# Patient Record
Sex: Male | Born: 1945 | Race: Black or African American | Hispanic: No | Smoking: Never smoker
Health system: Southern US, Community
[De-identification: ages and names within clinical notes are randomized; demographics above are authoritative.]

## PROBLEM LIST (undated history)

## (undated) DIAGNOSIS — M719 Bursopathy, unspecified: Secondary | ICD-10-CM

## (undated) DIAGNOSIS — M1990 Unspecified osteoarthritis, unspecified site: Secondary | ICD-10-CM

## (undated) DIAGNOSIS — R768 Other specified abnormal immunological findings in serum: Secondary | ICD-10-CM

## (undated) DIAGNOSIS — R7689 Other specified abnormal immunological findings in serum: Secondary | ICD-10-CM

## (undated) DIAGNOSIS — I10 Essential (primary) hypertension: Secondary | ICD-10-CM

## (undated) DIAGNOSIS — E559 Vitamin D deficiency, unspecified: Secondary | ICD-10-CM

## (undated) DIAGNOSIS — R7303 Prediabetes: Secondary | ICD-10-CM

## (undated) HISTORY — DX: Bursopathy, unspecified: M71.9

## (undated) HISTORY — DX: Unspecified osteoarthritis, unspecified site: M19.90

## (undated) HISTORY — DX: Essential (primary) hypertension: I10

## (undated) HISTORY — DX: Prediabetes: R73.03

## (undated) HISTORY — DX: Other specified abnormal immunological findings in serum: R76.8

## (undated) HISTORY — DX: Other specified abnormal immunological findings in serum: R76.89

## (undated) HISTORY — DX: Vitamin D deficiency, unspecified: E55.9

---

## 2019-02-13 ENCOUNTER — Emergency Department (HOSPITAL_COMMUNITY)
Admission: EM | Admit: 2019-02-13 | Discharge: 2019-02-13 | Disposition: A | Discharge status: Home / Self Care | Payer: Self-pay | Attending: Emergency Medicine | Admitting: Emergency Medicine

## 2019-02-13 ENCOUNTER — Emergency Department (HOSPITAL_COMMUNITY): Payer: Self-pay

## 2019-02-13 DIAGNOSIS — M19042 Primary osteoarthritis, left hand: Secondary | ICD-10-CM

## 2019-02-13 DIAGNOSIS — M7042 Prepatellar bursitis, left knee: Secondary | ICD-10-CM | POA: Insufficient documentation

## 2019-02-13 DIAGNOSIS — Y939 Activity, unspecified: Secondary | ICD-10-CM | POA: Insufficient documentation

## 2019-02-13 LAB — POCT I-STAT EG7
Acid-base deficit: 2 mmol/L (ref 0.0–2.0)
Bicarbonate: 23.6 mmol/L (ref 20.0–28.0)
Calcium, Ion: 1.07 mmol/L — ABNORMAL LOW (ref 1.15–1.40)
HCT: 36 % — ABNORMAL LOW (ref 39.0–52.0)
Hemoglobin: 12.2 g/dL — ABNORMAL LOW (ref 13.0–17.0)
O2 Saturation: 59 %
PCO2 VEN: 43.3 mmHg — AB (ref 44.0–60.0)
Potassium: 3.6 mmol/L (ref 3.5–5.1)
Sodium: 139 mmol/L (ref 135–145)
TCO2: 25 mmol/L (ref 22–32)
pH, Ven: 7.344 (ref 7.250–7.430)
pO2, Ven: 33 mmHg (ref 32.0–45.0)

## 2019-02-13 LAB — CBC WITH DIFFERENTIAL/PLATELET
Abs Immature Granulocytes: 0.01 10*3/uL (ref 0.00–0.07)
Basophils Absolute: 0 10*3/uL (ref 0.0–0.1)
Basophils Relative: 0 %
Eosinophils Absolute: 0.1 10*3/uL (ref 0.0–0.5)
Eosinophils Relative: 2 %
HCT: 38.5 % — ABNORMAL LOW (ref 39.0–52.0)
Hemoglobin: 12.2 g/dL — ABNORMAL LOW (ref 13.0–17.0)
Immature Granulocytes: 0 %
Lymphocytes Relative: 22 %
Lymphs Abs: 1.1 10*3/uL (ref 0.7–4.0)
MCH: 29.8 pg (ref 26.0–34.0)
MCHC: 31.7 g/dL (ref 30.0–36.0)
MCV: 93.9 fL (ref 80.0–100.0)
MONOS PCT: 11 %
Monocytes Absolute: 0.5 10*3/uL (ref 0.1–1.0)
NEUTROS PCT: 65 %
Neutro Abs: 3.1 10*3/uL (ref 1.7–7.7)
Platelets: 289 10*3/uL (ref 150–400)
RBC: 4.1 MIL/uL — ABNORMAL LOW (ref 4.22–5.81)
RDW: 11 % — ABNORMAL LOW (ref 11.5–15.5)
WBC: 4.8 10*3/uL (ref 4.0–10.5)
nRBC: 0 % (ref 0.0–0.2)

## 2019-02-13 LAB — CBG MONITORING, ED: Glucose-Capillary: 86 mg/dL (ref 70–99)

## 2019-02-13 LAB — BASIC METABOLIC PANEL
Anion gap: 6 (ref 5–15)
BUN: 8 mg/dL (ref 8–23)
CALCIUM: 8.4 mg/dL — AB (ref 8.9–10.3)
CO2: 25 mmol/L (ref 22–32)
Chloride: 106 mmol/L (ref 98–111)
Creatinine, Ser: 0.61 mg/dL (ref 0.61–1.24)
GFR calc Af Amer: >60 mL/min (ref >60)
GFR calc non Af Amer: >60 mL/min (ref >60)
Glucose, Bld: 102 mg/dL — ABNORMAL HIGH (ref 70–99)
Potassium: 3.6 mmol/L (ref 3.5–5.1)
Sodium: 137 mmol/L (ref 135–145)

## 2019-02-13 LAB — I-STAT CREATININE, ED: Creatinine, Ser: 0.6 mg/dL — ABNORMAL LOW (ref 0.61–1.24)

## 2019-02-13 MED ORDER — DICLOFENAC SODIUM 50 MG PO TBEC: 50.0000 mg | DELAYED_RELEASE_TABLET | Freq: Two times a day (BID) | ORAL | 1 refills | Status: DC

## 2019-02-13 MED ORDER — DEXAMETHASONE SODIUM PHOSPHATE 10 MG/ML IJ SOLN
10.0000 mg | Freq: Once | INTRAMUSCULAR | Status: AC
  Administered 2019-02-13: 10 mg via INTRAMUSCULAR
  Filled 2019-02-13: qty 1

## 2019-02-13 MED ORDER — KETOROLAC TROMETHAMINE 30 MG/ML IJ SOLN
30.0000 mg | Freq: Once | INTRAMUSCULAR | Status: AC
  Administered 2019-02-13: 30 mg via INTRAMUSCULAR
  Filled 2019-02-13: qty 1

## 2019-02-13 NOTE — ED Notes (Signed)
Discharge instructions and prescriptions discussed with Pt. Pt verbalized understanding. Pt stable and leaving via WC with family.

## 2019-02-13 NOTE — ED Provider Notes (Signed)
[facility] EMERGENCY DEPARTMENT Provider Note   CSN: [id number] Arrival date & time: 02/13/19  1527    History   Chief Complaint Chief Complaint  Patient presents with  . Joint Pain    HPI George Walker is a 73 y.o. male who is new to the area recently moved from [place].  There is a language barrier and translation is provided by the patient's family who is at bedside.  Family states that he has no contributing past medical history the patient has a history of joint pain predominantly on the left side.  He complains of pain in his left great toe, left knee and left hand.  He has daily pain however over the past several days and since coming to the [place] has had worsening pain.  He denies a history of gout.  He is taking diclofenac in the past which has been very helpful.  He does not have a current primary care physician.  He denies fevers or chills.  He denies history of previous injury.     HPI  No past medical history on file.  There are no active problems to display for this patient.         Home Medications    Prior to Admission medications   Medication Sig Start Date End Date Taking? Authorizing Provider  diclofenac (VOLTAREN) 50 MG EC tablet Take 1 tablet (50 mg total) by mouth 2 (two) times daily. 02/13/19   Arthor Captain, PA-C    Family History No family history on file.  Social History Social History   Tobacco Use  . Smoking status: Not on file  Substance Use Topics  . Alcohol use: Not on file  . Drug use: Not on file     Allergies   Patient has no known allergies.   Review of Systems Review of Systems Ten systems reviewed and are negative for acute change, except as noted in the HPI.    Physical Exam Updated Vital Signs BP (!) 182/98   Pulse 92   Temp 99.3 F (37.4 C) (Oral)   Resp 16   SpO2 100%   Physical Exam  Physical Exam  Nursing note and vitals reviewed. Constitutional: He appears well-developed and  well-nourished. No distress.  HENT:  Head: Normocephalic and atraumatic.  Eyes: Conjunctivae normal are normal. No scleral icterus.  Neck: Normal range of motion. Neck supple.  Cardiovascular: Normal rate, regular rhythm and normal heart sounds.   Pulmonary/Chest: Effort normal and breath sounds normal. No respiratory distress.  Abdominal: Soft. There is no tenderness.  Musculoskeletal: Left knee is mildly warm compared to the right without significant swelling, effusion or erythema, crepitus with movement of the knee and unable to flex fully.  Ligaments are stable.  There is mild swelling of the first and second MCP of the left hand.  Full range of motion of the joints and normal strength, no heat or redness. Neurological: He is alert.  Skin: Skin is warm and dry. He is not diaphoretic.  Psychiatric: His behavior is normal.    ED Treatments / Results  Labs (all labs ordered are listed, but only abnormal results are displayed) Labs Reviewed  CBC WITH DIFFERENTIAL/PLATELET - Abnormal; Notable for the following components:      Result Value   RBC 4.10 (*)    Hemoglobin 12.2 (*)    HCT 38.5 (*)    RDW 11.0 (*)    All other components within normal limits  BASIC METABOLIC PANEL -  Abnormal; Notable for the following components:   Glucose, Bld 102 (*)    Calcium 8.4 (*)    All other components within normal limits  I-STAT CREATININE, ED - Abnormal; Notable for the following components:   Creatinine, Ser 0.60 (*)    All other components within normal limits  POCT I-STAT EG7 - Abnormal; Notable for the following components:   pCO2, Ven 43.3 (*)    Calcium, Ion 1.07 (*)    HCT 36.0 (*)    Hemoglobin 12.2 (*)    All other components within normal limits  CBG MONITORING, ED    EKG None  Radiology Dg Knee Complete 4 Views Left  Result Date: 02/13/2019 CLINICAL DATA:  73 year old male with a history of chronic pain EXAM: LEFT KNEE - COMPLETE 4+ VIEW COMPARISON:  None. FINDINGS: No  acute displaced fracture. No joint effusion. Mild prepatellar soft tissue swelling. No radiopaque foreign body. No significant degenerative changes. IMPRESSION: Negative for acute bony abnormality. Mild prepatellar soft tissue swelling Electronically Signed   By: Gilmer Mor D.O.   On: 02/13/2019 18:32   Dg Hand Complete Left  Result Date: 02/13/2019 CLINICAL DATA:  73 year old male with a history of left hand pain EXAM: LEFT HAND - COMPLETE 3+ VIEW COMPARISON:  None. FINDINGS: Osteopenia. No acute displaced fracture. Mild degenerative changes of the interphalangeal joints. Erosion at the base of the proximal phalanx, radial aspect, second digit of the left hand. Mild degenerative changes at the wrist. IMPRESSION: Negative for acute fracture. Early erosion at the base of the proximal phalanx of the second digit of the left hand. Electronically Signed   By: Gilmer Mor D.O.   On: 02/13/2019 18:34    Procedures Procedures (including critical care time)  Medications Ordered in ED Medications  dexamethasone (DECADRON) injection 10 mg (10 mg Intramuscular Given 02/13/19 1858)  ketorolac (TORADOL) 30 MG/ML injection 30 mg (30 mg Intramuscular Given 02/13/19 1858)     Initial Impression / Assessment and Plan / ED Course  I have reviewed the triage vital signs and the nursing notes.  Pertinent labs & imaging results that were available during my care of the patient were reviewed by me and considered in my medical decision making (see chart for details).        Patient without elevated white blood cell count.  Given the tenderness over the patella and swelling over the patella on the patient's knee x-ray I suspect he has a developing prepatellar bursitis.  Patient was given a shot of Toradol and Decadron here after obtaining labs.  Have low suspicion for gout or septic joint he is able to move the joint and ambulate review of the left film shows mild degenerative change in the interphalangeal  joints..  I spoke with our case manager and patient will follow closely with Dr. Hyman Hopes   Final Clinical Impressions(s) / ED Diagnoses   Final diagnoses:  Prepatellar bursitis of left knee  Primary osteoarthritis of left hand    ED Discharge Orders         Ordered    diclofenac (VOLTAREN) 50 MG EC tablet  2 times daily     02/13/19 1853           Arthor Captain, PA-C 02/13/19 2254    Loren Racer, MD 02/17/19 [phone]

## 2019-02-13 NOTE — Discharge Instructions (Signed)
Contact a health care provider if: °Your symptoms do not improve. °Your symptoms get worse. °Your symptoms keep coming back after treatment. °You develop a fever and have warmth, redness, and swelling over your knee °

## 2019-02-13 NOTE — Progress Notes (Signed)
ED CM spoke with the patient, his son-in-law and a family friend at the bedside. Patient needs a PCP. Provided with a flyer for the [facility] and encouraged the patient/family to call tomorrow to schedule an appointment. The patient's friend states he is familiar with [facility]. They agree to call to schedule an appointment.

## 2019-02-13 NOTE — ED Triage Notes (Signed)
Patient to ED c/o worsening bilateral joint pain to elbows, knees, neck - worse with movement - takes ibuprofen without relief. Patient does not speak [place] - moved here from [place] about a month ago. According to family, patient denies any acute symptoms. Ambulatory with steady gait, no unilateral weakness/numbness.

## 2019-02-26 ENCOUNTER — Other Ambulatory Visit: Payer: Self-pay

## 2019-02-26 ENCOUNTER — Encounter: Payer: Self-pay | Admitting: Family Medicine

## 2019-02-26 ENCOUNTER — Ambulatory Visit (INDEPENDENT_AMBULATORY_CARE_PROVIDER_SITE_OTHER): Payer: Self-pay | Admitting: Family Medicine

## 2019-02-26 VITALS — BP 152/76 | HR 98 | Temp 98.1°F | Ht 60.0 in | Wt 122.0 lb

## 2019-02-26 DIAGNOSIS — Z7689 Persons encountering health services in other specified circumstances: Secondary | ICD-10-CM

## 2019-02-26 DIAGNOSIS — Z0000 Encounter for general adult medical examination without abnormal findings: Secondary | ICD-10-CM

## 2019-02-26 DIAGNOSIS — I10 Essential (primary) hypertension: Secondary | ICD-10-CM | POA: Insufficient documentation

## 2019-02-26 DIAGNOSIS — R7303 Prediabetes: Secondary | ICD-10-CM | POA: Insufficient documentation

## 2019-02-26 DIAGNOSIS — M7042 Prepatellar bursitis, left knee: Secondary | ICD-10-CM | POA: Insufficient documentation

## 2019-02-26 DIAGNOSIS — Z09 Encounter for follow-up examination after completed treatment for conditions other than malignant neoplasm: Secondary | ICD-10-CM

## 2019-02-26 DIAGNOSIS — Z131 Encounter for screening for diabetes mellitus: Secondary | ICD-10-CM

## 2019-02-26 DIAGNOSIS — M7041 Prepatellar bursitis, right knee: Secondary | ICD-10-CM

## 2019-02-26 LAB — POCT GLYCOSYLATED HEMOGLOBIN (HGB A1C): Hemoglobin A1C: 5.8 % — AB (ref 4.0–5.6)

## 2019-02-26 LAB — POCT URINALYSIS DIP (MANUAL ENTRY)
Bilirubin, UA: NEGATIVE
Glucose, UA: NEGATIVE mg/dL
Ketones, POC UA: NEGATIVE mg/dL
Leukocytes, UA: NEGATIVE
Nitrite, UA: NEGATIVE
Protein Ur, POC: 30 mg/dL — AB
Spec Grav, UA: 1.025 (ref 1.010–1.025)
Urobilinogen, UA: 0.2 E.U./dL
pH, UA: 5 (ref 5.0–8.0)

## 2019-02-26 MED ORDER — PREDNISONE 10 MG PO TABS: ORAL_TABLET | ORAL | 0 refills | Status: DC

## 2019-02-26 MED ORDER — KETOROLAC TROMETHAMINE 30 MG/ML IJ SOLN
30.0000 mg | Freq: Once | INTRAMUSCULAR | Status: AC
  Administered 2019-02-26: 30 mg via INTRAMUSCULAR

## 2019-02-26 MED ORDER — METHYLPREDNISOLONE SODIUM SUCC 125 MG IJ SOLR
60.0000 mg | Freq: Once | INTRAMUSCULAR | Status: AC
  Administered 2019-02-26: 60 mg via INTRAMUSCULAR

## 2019-02-26 MED ORDER — KETOROLAC TROMETHAMINE 60 MG/2ML IM SOLN: 60.0000 mg | Freq: Once | INTRAMUSCULAR | Status: DC

## 2019-02-26 MED ORDER — METHYLPREDNISOLONE SODIUM SUCC 125 MG IJ SOLR: 125.0000 mg | Freq: Once | INTRAMUSCULAR | Status: DC

## 2019-02-26 MED ORDER — DICLOFENAC SODIUM 50 MG PO TBEC: 50.0000 mg | DELAYED_RELEASE_TABLET | Freq: Two times a day (BID) | ORAL | 1 refills | Status: AC

## 2019-02-26 MED FILL — predniSONE 10 MG TABS: 10 | 6 days supply | Qty: 21 | Fill #0

## 2019-02-26 NOTE — Progress Notes (Signed)
[facility]  New Patient--Hospital Follow Up--Establish Care  Subjective:  Patient ID: George Walker, male    DOB: 07/25/1946  Age: 73 y.o. MRN: [id number]  CC:  Chief Complaint  Patient presents with  . Establish Care  . Hospitalization Follow-up    HPI George Walker is a 73 year old male who presents for Hospital Follow and to Establish Care today.    Past Medical History:  Diagnosis Date  . Arthritis   . Bursitis   . Hypertension   . Pre-diabetes    Current Status: Since his last ED visit on 02/13/2019 for Arthritis and Bursitis in his joints, where he was treated and discharged. He has recently relocated to the [place] from [place] X a few months now. He continues to have increasing joint pain. He is accompanied today by his son-in-law. He arrives via a wheelchair today. He is not taking any medications for pain at this time. He was prescribed a 7-day supply of Diclofenac to aide in pain and discomfort. He denies visual changes, chest pain, cough, shortness of breath, heart palpitations, and falls. He has occasional headaches and dizziness with position changes. Denies severe headaches, confusion, seizures, double vision, and blurred vision, nausea and vomiting.  He denies fevers, chills, fatigue, recent infections, weight loss, and night sweats. No reports of GI problems such as diarrhea, and constipation. He has no reports of blood in stools, dysuria and hematuria. No depression or anxiety reported.   History reviewed. No pertinent surgical history.  Family History  Problem Relation Age of Onset  . Other Mother        healthy     Social History   Socioeconomic History  . Marital status: Widowed    Spouse name: Not on file  . Number of children: Not on file  . Years of education: Not on file  . Highest education level: Not on file  Occupational History  . Not on file  Social Needs  . Financial resource strain: Not  on file  . Food insecurity:    Worry: Not on file    Inability: Not on file  . Transportation needs:    Medical: Not on file    Non-medical: Not on file  Tobacco Use  . Smoking status: Never Smoker  . Smokeless tobacco: Current User  Substance and Sexual Activity  . Alcohol use: Yes  . Drug use: Never  . Sexual activity: Never  Lifestyle  . Physical activity:    Days per week: Not on file    Minutes per session: Not on file  . Stress: Not on file  Relationships  . Social connections:    Talks on phone: Not on file    Gets together: Not on file    Attends religious service: Not on file    Active member of club or organization: Not on file    Attends meetings of clubs or organizations: Not on file    Relationship status: Not on file  . Intimate partner violence:    Fear of current or ex partner: Not on file    Emotionally abused: Not on file    Physically abused: Not on file    Forced sexual activity: Not on file  Other Topics Concern  . Not on file  Social History Narrative  . Not on file    Outpatient Medications Prior to Visit  Medication Sig Dispense Refill  . diclofenac (VOLTAREN) 50 MG EC tablet Take  1 tablet (50 mg total) by mouth 2 (two) times daily. 14 tablet 1   No facility-administered medications prior to visit.     No Known Allergies  ROS Review of Systems  Constitutional: Negative.   HENT: Negative.   Eyes: Negative.   Respiratory: Negative.   Cardiovascular: Negative.   Gastrointestinal: Negative.   Endocrine: Negative.   Genitourinary: Negative.   Musculoskeletal: Positive for joint swelling (Generalized joint pain and swellilng. ).  Skin: Negative.   Allergic/Immunologic: Negative.   Neurological: Positive for dizziness and headaches.  Hematological: Negative.   Psychiatric/Behavioral: Negative.    Objective:    Physical Exam  Constitutional: He is oriented to person, place, and time. He appears well-developed and well-nourished.    Eyes: Conjunctivae are normal.  Neck: Normal range of motion. Neck supple.  Cardiovascular: Normal rate, regular rhythm, normal heart sounds and intact distal pulses.  Pulmonary/Chest: Effort normal and breath sounds normal.  Abdominal: Soft. Bowel sounds are normal.  Musculoskeletal:        General: Tenderness and edema present.     Comments: Erythema, pain, and swelling in knee, elbows, and finger joints.   Neurological: He is alert and oriented to person, place, and time. He has normal reflexes.  Skin: Skin is warm and dry.  Psychiatric: He has a normal mood and affect. His behavior is normal. Judgment and thought content normal.  Nursing note and vitals reviewed.   BP (!) 152/76   Pulse 98   Temp 98.1 F (36.7 C) (Oral)   Ht 5' (1.524 m)   Wt 122 lb (55.3 kg)   SpO2 100%   BMI 23.83 kg/m  Wt Readings from Last 3 Encounters:  02/26/19 122 lb (55.3 kg)     Health Maintenance Due  Topic Date Due  . Hepatitis C Screening  1946-08-12  . COLONOSCOPY  10/03/1997  . PNA vac Low Risk Adult (1 of 2 - PCV13) 10/03/2012    There are no preventive care reminders to display for this patient.  No results found for: TSH Lab Results  Component Value Date   WBC 4.8 02/13/2019   HGB 12.2 (L) 02/13/2019   HCT 36.0 (L) 02/13/2019   MCV 93.9 02/13/2019   PLT 289 02/13/2019   Lab Results  Component Value Date   NA 139 02/13/2019   K 3.6 02/13/2019   CO2 25 02/13/2019   GLUCOSE 102 (H) 02/13/2019   BUN 8 02/13/2019   CREATININE 0.60 (L) 02/13/2019   CALCIUM 8.4 (L) 02/13/2019   ANIONGAP 6 02/13/2019   No results found for: CHOL No results found for: HDL No results found for: LDLCALC No results found for: TRIG No results found for: [facility] Lab Results  Component Value Date   HGBA1C 5.8 (A) 02/26/2019   Assessment & Plan:   1. Hospital discharge follow-up  2. Encounter to establish care  3. Prepatellar bursitis of both knees We will initiate Prednisone taper  today. Toradol and Solu-Medrol injection given in office today. - ketorolac (TORADOL) 30 MG/ML injection 30 mg - methylPREDNISolone sodium succinate (SOLU-MEDROL) 125 mg/2 mL injection 60 mg - diclofenac (VOLTAREN) 50 MG EC tablet; Take 1 tablet (50 mg total) by mouth 2 (two) times daily.  Dispense: 60 tablet; Refill: 1 - predniSONE (DELTASONE) 10 MG tablet; Day #1: Take 6 tablets  Day #2: Take 5 tablets Day #3: Take 4 tablets Day #4: Take 3 tablets Day #5: Take 2 tablets Day #6: Take 1 tablet, then complete.  Dispense: 21  tablet; Refill: 0  4. Hypertension, unspecified type Blood pressure is elevated today at 152/76 today. Probable r/t increased pain. We will re-evaluate blood pressure at next office visit.   5. Pre-diabetes  6. Screening for diabetes mellitus Hgb A1c is stable at 5.8 today. He will continue to decrease foods/beverages high in sugars and carbs and follow Heart Healthy or DASH diet. Increase physical activity to at least 30 minutes cardio exercise daily.  - POCT glycosylated hemoglobin (Hb A1C) - POCT urinalysis dipstick  7. Healthcare maintenance Results are pending.  - CBC with Differential - Comprehensive metabolic panel - TSH - Lipid Panel - Vitamin D, 25-hydroxy - Vitamin B12 - Uric Acid - Rheumatoid Arthritis Profile  8. Follow up He will follow up in 2 weeks.   Meds ordered this encounter  Medications  . DISCONTD: methylPREDNISolone sodium succinate (SOLU-MEDROL) 125 mg/2 mL injection 125 mg  . DISCONTD: ketorolac (TORADOL) injection 60 mg  . ketorolac (TORADOL) 30 MG/ML injection 30 mg  . methylPREDNISolone sodium succinate (SOLU-MEDROL) 125 mg/2 mL injection 60 mg  . diclofenac (VOLTAREN) 50 MG EC tablet    Sig: Take 1 tablet (50 mg total) by mouth 2 (two) times daily.    Dispense:  60 tablet    Refill:  1  . predniSONE (DELTASONE) 10 MG tablet    Sig: Day #1: Take 6 tablets  Day #2: Take 5 tablets Day #3: Take 4 tablets Day #4: Take 3  tablets Day #5: Take 2 tablets Day #6: Take 1 tablet, then complete.    Dispense:  21 tablet    Refill:  0    Orders Placed This Encounter  Procedures  . CBC with Differential  . Comprehensive metabolic panel  . TSH  . Lipid Panel  . Vitamin D, 25-hydroxy  . Vitamin B12  . Uric Acid  . Rheumatoid Arthritis Profile  . POCT glycosylated hemoglobin (Hb A1C)  . POCT urinalysis dipstick    Referral Orders  No referral(s) requested today    Raliegh Ip,  MSN, FNP-C [facility] [address] [place], [place] [address] [phone]    Problem List Items Addressed This Visit    None    Visit Diagnoses    Hospital discharge follow-up    -  Primary   Encounter to establish care       Prepatellar bursitis of both knees       Relevant Medications   ketorolac (TORADOL) 30 MG/ML injection 30 mg (Completed)   methylPREDNISolone sodium succinate (SOLU-MEDROL) 125 mg/2 mL injection 60 mg (Completed)   diclofenac (VOLTAREN) 50 MG EC tablet   predniSONE (DELTASONE) 10 MG tablet   Hypertension, unspecified type       Pre-diabetes       Screening for diabetes mellitus       Relevant Orders   POCT glycosylated hemoglobin (Hb A1C) (Completed)   POCT urinalysis dipstick (Completed)   Healthcare maintenance       Relevant Orders   CBC with Differential   Comprehensive metabolic panel   TSH   Lipid Panel   Vitamin D, 25-hydroxy   Vitamin B12   Uric Acid   Rheumatoid Arthritis Profile   Follow up          Meds ordered this encounter  Medications  . DISCONTD: methylPREDNISolone sodium succinate (SOLU-MEDROL) 125 mg/2 mL injection 125 mg  . DISCONTD: ketorolac (TORADOL) injection 60 mg  . ketorolac (TORADOL) 30 MG/ML injection 30 mg  .  methylPREDNISolone sodium succinate (SOLU-MEDROL) 125 mg/2 mL injection 60 mg  . diclofenac (VOLTAREN) 50 MG EC tablet    Sig: Take 1 tablet (50 mg total) by mouth 2 (two) times daily.    Dispense:  60  tablet    Refill:  1  . predniSONE (DELTASONE) 10 MG tablet    Sig: Day #1: Take 6 tablets  Day #2: Take 5 tablets Day #3: Take 4 tablets Day #4: Take 3 tablets Day #5: Take 2 tablets Day #6: Take 1 tablet, then complete.    Dispense:  21 tablet    Refill:  0    Follow-up: Return in about 2 weeks (around 03/12/2019).    Kallie Locks, FNP

## 2019-02-26 NOTE — Patient Instructions (Signed)
Prednisone tablets What is this medicine? PREDNISONE (PRED ni sone) is a corticosteroid. It is commonly used to treat inflammation of the skin, joints, lungs, and other organs. Common conditions treated include asthma, allergies, and arthritis. It is also used for other conditions, such as blood disorders and diseases of the adrenal glands. This medicine may be used for other purposes; ask your health care provider or pharmacist if you have questions. COMMON BRAND NAME(S): Deltasone, Predone, Sterapred, Sterapred DS What should I tell my health care provider before I take this medicine? They need to know if you have any of these conditions: -Cushing's syndrome -diabetes -glaucoma -heart disease -high blood pressure -infection (especially a virus infection such as chickenpox, cold sores, or herpes) -kidney disease -liver disease -mental illness -myasthenia gravis -osteoporosis -seizures -stomach or intestine problems -thyroid disease -an unusual or allergic reaction to lactose, prednisone, other medicines, foods, dyes, or preservatives -pregnant or trying to get pregnant -breast-feeding How should I use this medicine? Take this medicine by mouth with a glass of water. Follow the directions on the prescription label. Take this medicine with food. If you are taking this medicine once a day, take it in the morning. Do not take more medicine than you are told to take. Do not suddenly stop taking your medicine because you may develop a severe reaction. Your doctor will tell you how much medicine to take. If your doctor wants you to stop the medicine, the dose may be slowly lowered over time to avoid any side effects. Talk to your pediatrician regarding the use of this medicine in children. Special care may be needed. Overdosage: If you think you have taken too much of this medicine contact a poison control center or emergency room at once. NOTE: This medicine is only for you. Do not share this  medicine with others. What if I miss a dose? If you miss a dose, take it as soon as you can. If it is almost time for your next dose, talk to your doctor or health care professional. You may need to miss a dose or take an extra dose. Do not take double or extra doses without advice. What may interact with this medicine? Do not take this medicine with any of the following medications: -metyrapone -mifepristone This medicine may also interact with the following medications: -aminoglutethimide -amphotericin B -aspirin and aspirin-like medicines -barbiturates -certain medicines for diabetes, like glipizide or glyburide -cholestyramine -cholinesterase inhibitors -cyclosporine -digoxin -diuretics -ephedrine -male hormones, like estrogens and birth control pills -isoniazid -ketoconazole -NSAIDS, medicines for pain and inflammation, like ibuprofen or naproxen -phenytoin -rifampin -toxoids -vaccines -warfarin This list may not describe all possible interactions. Give your health care provider a list of all the medicines, herbs, non-prescription drugs, or dietary supplements you use. Also tell them if you smoke, drink alcohol, or use illegal drugs. Some items may interact with your medicine. What should I watch for while using this medicine? Visit your doctor or health care professional for regular checks on your progress. If you are taking this medicine over a prolonged period, carry an identification card with your name and address, the type and dose of your medicine, and your doctor's name and address. This medicine may increase your risk of getting an infection. Tell your doctor or health care professional if you are around anyone with measles or chickenpox, or if you develop sores or blisters that do not heal properly. If you are going to have surgery, tell your doctor or health care professional that  you have taken this medicine within the last twelve months. Ask your doctor or health  care professional about your diet. You may need to lower the amount of salt you eat. This medicine may increase blood sugar. Ask your healthcare provider if changes in diet or medicines are needed if you have diabetes. What side effects may I notice from receiving this medicine? Side effects that you should report to your doctor or health care professional as soon as possible: -allergic reactions like skin rash, itching or hives, swelling of the face, lips, or tongue -changes in emotions or moods -changes in vision -depressed mood -eye pain -fever or chills, cough, sore throat, pain or difficulty passing urine -signs and symptoms of high blood sugar such as being more thirsty or hungry or having to urinate more than normal. You may also feel very tired or have blurry vision. -swelling of ankles, feet Side effects that usually do not require medical attention (report to your doctor or health care professional if they continue or are bothersome): -confusion, excitement, restlessness -headache -nausea, vomiting -skin problems, acne, thin and shiny skin -trouble sleeping -weight gain This list may not describe all possible side effects. Call your doctor for medical advice about side effects. You may report side effects to FDA at 1-800-FDA-1088. Where should I keep my medicine? Keep out of the reach of children. Store at room temperature between 15 and 30 degrees C (59 and 86 degrees F). Protect from light. Keep container tightly closed. Throw away any unused medicine after the expiration date. NOTE: This sheet is a summary. It may not cover all possible information. If you have questions about this medicine, talk to your doctor, pharmacist, or health care provider.  2019 [employer] Standard (2018-09-05 10:54:22) Methylprednisolone Solution for Injection What is this medicine? METHYLPREDNISOLONE (meth ill pred NISS oh lone) is a corticosteroid. It is commonly used to treat inflammation of  the skin, joints, lungs, and other organs. Common conditions treated include asthma, allergies, and arthritis. It is also used for other conditions, such as blood disorders and diseases of the adrenal glands. This medicine may be used for other purposes; ask your health care provider or pharmacist if you have questions. COMMON BRAND NAME(S): A-Methapred, Solu-Medrol What should I tell my health care provider before I take this medicine? They need to know if you have any of these conditions: -Cushing's syndrome -eye disease, vision problems -diabetes -glaucoma -heart disease -high blood pressure -infection (especially a virus infection such as chickenpox, cold sores, or herpes) -liver disease -mental illness -myasthenia gravis -osteoporosis -recently received or scheduled to receive a vaccine -seizures -stomach or intestine problems -thyroid disease -an unusual or allergic reaction to lactose, methylprednisolone, other medicines, foods, dyes, or preservatives -pregnant or trying to get pregnant -breast-feeding How should I use this medicine? This medicine is for injection or infusion into a vein. It is also for injection into a muscle. It is given by a health care professional in a hospital or clinic setting. Talk to your pediatrician regarding the use of this medicine in children. While this drug may be prescribed for selected conditions, precautions do apply. Overdosage: If you think you have taken too much of this medicine contact a poison control center or emergency room at once. NOTE: This medicine is only for you. Do not share this medicine with others. What if I miss a dose? This does not apply. What may interact with this medicine? Do not take this medicine with any of the following medications: -  alefacept -echinacea -iopamidol -live virus vaccines -metyrapone -mifepristone This medicine may also interact with the following medications: -amphotericin B -aspirin and  aspirin-like medicines -certain antibiotics like erythromycin, clarithromycin, troleandomycin -certain medicines for diabetes -certain medicines for fungal infection like ketoconazole -certain medicines for seizures like carbamazepine, phenobarbital, phenytoin -certain medicines that treat or prevent blood clots like warfarin -cyclosporine -digoxin -diuretics -male hormones, like estrogens and birth control pills -isoniazid -NSAIDS, medicines for pain and inflammation, like ibuprofen or naproxen -other medicines for myasthenia gravis -rifampin -vaccines This list may not describe all possible interactions. Give your health care provider a list of all the medicines, herbs, non-prescription drugs, or dietary supplements you use. Also tell them if you smoke, drink alcohol, or use illegal drugs. Some items may interact with your medicine. What should I watch for while using this medicine? Tell your doctor or healthcare professional if your symptoms do not start to get better or if they get worse. Do not stop taking except on your doctor's advice. You may develop a severe reaction. Your doctor will tell you how much medicine to take. Your condition will be monitored carefully while you are receiving this medicine. This medicine may increase your risk of getting an infection. Tell your doctor or health care professional if you are around anyone with measles or chickenpox, or if you develop sores or blisters that do not heal properly. This medicine may affect blood sugar levels. If you have diabetes, check with your doctor or health care professional before you change your diet or the dose of your diabetic medicine. Tell your doctor or health care professional right away if you have any change in your eyesight. Using this medicine for a long time may increase your risk of low bone mass. Talk to your doctor about bone health. What side effects may I notice from receiving this medicine? Side effects  that you should report to your doctor or health care professional as soon as possible: -allergic reactions like skin rash, itching or hives, swelling of the face, lips, or tongue -bloody or tarry stools -changes in vision -hallucination, loss of contact with reality -muscle cramps -muscle pain -palpitations -signs and symptoms of high blood sugar such as dizziness; dry mouth; dry skin; fruity breath; nausea; stomach pain; increased hunger or thirst; increased urination -signs and symptoms of infection like fever or chills; cough; sore throat; pain or trouble passing urine -trouble passing urine or change in the amount of urine Side effects that usually do not require medical attention (report to your doctor or health care professional if they continue or are bothersome): -changes in emotions or mood -constipation -diarrhea -excessive hair growth on the face or body -headache -nausea, vomiting -pain, redness, or irritation at site where injected -trouble sleeping -weight gain This list may not describe all possible side effects. Call your doctor for medical advice about side effects. You may report side effects to FDA at 1-800-FDA-1088. Where should I keep my medicine? This drug is given in a hospital or clinic and will not be stored at home. NOTE: This sheet is a summary. It may not cover all possible information. If you have questions about this medicine, talk to your doctor, pharmacist, or health care provider.  2019 [employer] Standard (2016-02-12 16:21:28) Ketorolac injection What is this medicine? KETOROLAC (kee toe ROLE ak) is a non-steroidal anti-inflammatory drug (NSAID). It is used to treat moderate to severe pain for up to 5 days. It is commonly used after surgery. This medicine should not be  used for more than 5 days. This medicine may be used for other purposes; ask your health care provider or pharmacist if you have questions. COMMON BRAND NAME(S): Toradol What should  I tell my health care provider before I take this medicine? They need to know if you have any of these conditions: -asthma, especially aspirin-sensitive asthma -bleeding problems -coronary artery bypass graft (CABG) surgery within the past 2 weeks -kidney disease -stomach bleed, ulcer, or other problem -taking aspirin, other NSAID, or probenecid -an unusual or allergic reaction to ketorolac, tromethamine, aspirin, other NSAIDs, other medicines, foods, dyes or preservatives -pregnant or trying to get pregnant -breast-feeding How should I use this medicine? This medicine is for injection into a muscle or into a vein. It is given by a health care professional in a hospital or clinic setting. Talk to your pediatrician regarding the use of this medicine in children. Special care may be needed. Patients over 57 years old may have a stronger reaction and need a smaller dose. Overdosage: If you think you have taken too much of this medicine contact a poison control center or emergency room at once. NOTE: This medicine is only for you. Do not share this medicine with others. What if I miss a dose? This does not apply. What may interact with this medicine? Do not take this medicine with any of the following medications: -aspirin and aspirin-like medicines -cidofovir -methotrexate -NSAIDs, medicines for pain and inflammation, like ibuprofen or naproxen -pentoxifylline -probenecid This medicine may also interact with the following medications: -alcohol -alendronate -alprazolam -carbamazepine -diuretics -flavocoxid -fluoxetine -ginkgo -lithium -medicines for blood pressure like enalapril -medicines that affect platelets like pentoxifylline -medicines that treat or prevent blood clots like heparin, warfarin -muscle relaxants -pemetrexed -phenytoin -thiothixene This list may not describe all possible interactions. Give your health care provider a list of all the medicines, herbs,  non-prescription drugs, or dietary supplements you use. Also tell them if you smoke, drink alcohol, or use illegal drugs. Some items may interact with your medicine. What should I watch for while using this medicine? Tell your doctor or healthcare professional if your symptoms do not start to get better or if they get worse. This medicine does not prevent heart attack or stroke. In fact, this medicine may increase the chance of a heart attack or stroke. The chance may increase with longer use of this medicine and in people who have heart disease. If you take aspirin to prevent heart attack or stroke, talk with your doctor or health care professional. Do not take medicines such as ibuprofen and naproxen with this medicine. Side effects such as stomach upset, nausea, or ulcers may be more likely to occur. Many medicines available without a prescription should not be taken with this medicine. This medicine can cause ulcers and bleeding in the stomach and intestines at any time during treatment. Do not smoke cigarettes or drink alcohol. These increase irritation to your stomach and can make it more susceptible to damage from this medicine. Ulcers and bleeding can happen without warning symptoms and can cause death. This medicine can cause you to bleed more easily. Try to avoid damage to your teeth and gums when you brush or floss your teeth. What side effects may I notice from receiving this medicine? Side effects that you should report to your doctor or health care professional as soon as possible: -allergic reactions like skin rash, itching or hives, swelling of the face, lips, or tongue -breathing problems -high blood pressure -nausea, vomiting -  redness, blistering, peeling or loosening of the skin, including inside the mouth -severe stomach pain -signs and symptoms of bleeding such as bloody or black, tarry stools; red or dark-brown urine; spitting up blood or brown material that looks like coffee  grounds; red spots on the skin; unusual bruising or bleeding from the eye, gums, or nose -signs and symptoms of a blood clot changes in vision; chest pain; severe, sudden headache; trouble speaking; sudden numbness or weakness of the face, arm, or leg -trouble passing urine or change in the amount of urine -unexplained weight gain or swelling -unusually weak or tired -yellowing of eyes or skin Side effects that usually do not require medical attention (report to your doctor or health care professional if they continue or are bothersome): -diarrhea -dizziness -headache -heartburn This list may not describe all possible side effects. Call your doctor for medical advice about side effects. You may report side effects to FDA at 1-800-FDA-1088. Where should I keep my medicine? This drug is given in a hospital or clinic and will not be stored at home. NOTE: This sheet is a summary. It may not cover all possible information. If you have questions about this medicine, talk to your doctor, pharmacist, or health care provider.  2019 [employer] Standard (2017-08-10 15:19:19) Diclofenac sodium enteric-coated tablets What is this medicine? DICLOFENAC (dye KLOE fen ak) is a non-steroidal anti-inflammatory drug (NSAID). It is used to reduce swelling and to treat pain. It may be used to treat osteoarthritis, rheumatoid arthritis, and ankylosing spondylitis. This medicine may be used for other purposes; ask your health care provider or pharmacist if you have questions. COMMON BRAND NAME(S): Voltaren What should I tell my health care provider before I take this medicine? They need to know if you have any of these conditions: -asthma, especially aspirin sensitive asthma -coronary artery bypass graft (CABG) surgery within the past 2 weeks -drink more than 3 alcohol containing drinks a day -heart disease or circulation problems like heart failure or leg edema (fluid retention) -high blood pressure -kidney  disease -liver disease -stomach bleeding or ulcers -an unusual or allergic reaction to diclofenac, aspirin, other NSAIDs, other medicines, foods, dyes, or preservatives -pregnant or trying to get pregnant -breast-feeding How should I use this medicine? Take this medicine by mouth with food and with a full glass of water. Do not crush or chew the medicine. Follow the directions on the prescription label. Take your medicine at regular intervals. Do not take your medicine more often than directed. Long-term, continuous use may increase the risk of heart attack or stroke. A special MedGuide will be given to you by the pharmacist with each prescription and refill. Be sure to read this information carefully each time. Talk to your pediatrician regarding the use of this medicine in children. Special care may be needed. Elderly patients over 86 years old may have a stronger reaction and need a smaller dose. Overdosage: If you think you have taken too much of this medicine contact a poison control center or emergency room at once. NOTE: This medicine is only for you. Do not share this medicine with others. What if I miss a dose? If you miss a dose, take it as soon as you can. If it is almost time for your next dose, take only that dose. Do not take double or extra doses. What may interact with this medicine? Do not take this medicine with any of the following medications: -cidofovir -ketorolac -methotrexate This medicine may also interact with  the following medications: -alcohol -aspirin and aspirin like medicines -cyclosporine -diuretics -lithium -medicines for blood pressure -medicines for osteoporosis -medicines that affect platelets -medicines that treat or prevent blood clots like warfarin -NSAIDs, medicines for pain and inflammation, like ibuprofen or naproxen -pemetrexed -steroid medicines like prednisone or cortisone This list may not describe all possible interactions. Give your  health care provider a list of all the medicines, herbs, non-prescription drugs, or dietary supplements you use. Also tell them if you smoke, drink alcohol, or use illegal drugs. Some items may interact with your medicine. What should I watch for while using this medicine? Tell your doctor or health care professional if your pain does not get better. Talk to your doctor before taking another medicine for pain. Do not treat yourself. This medicine does not prevent heart attack or stroke. In fact, this medicine may increase the chance of a heart attack or stroke. The chance may increase with longer use of this medicine and in people who have heart disease. If you take aspirin to prevent heart attack or stroke, talk with your doctor or health care professional. Do not take medicines such as ibuprofen and naproxen with this medicine. Side effects such as stomach upset, nausea, or ulcers may be more likely to occur. Many medicines available without a prescription should not be taken with this medicine. This medicine can cause ulcers and bleeding in the stomach and intestines at any time during treatment. Do not smoke cigarettes or drink alcohol. These increase irritation to your stomach and can make it more susceptible to damage from this medicine. Ulcers and bleeding can happen without warning symptoms and can cause death. You may get drowsy or dizzy. Do not drive, use machinery, or do anything that needs mental alertness until you know how this medicine affects you. Do not stand or sit up quickly, especially if you are an older patient. This reduces the risk of dizzy or fainting spells. This medicine can cause you to bleed more easily. Try to avoid damage to your teeth and gums when you brush or floss your teeth. What side effects may I notice from receiving this medicine? Side effects that you should report to your doctor or health care professional as soon as possible: -allergic reactions like skin rash,  itching or hives, swelling of the face, lips, or tongue -black or bloody stools, blood in the urine or vomit -blurred vision -chest pain -difficulty breathing or wheezing -nausea or vomiting -slurred speech or weakness on one side of the body -unexplained weight gain or swelling -unusually weak or tired -yellowing of eyes or skin Side effects that usually do not require medical attention (report to your doctor or health care professional if they continue or are bothersome): -constipation -diarrhea -dizziness -headache -heartburn This list may not describe all possible side effects. Call your doctor for medical advice about side effects. You may report side effects to FDA at 1-800-FDA-1088. Where should I keep my medicine? Keep out of the reach of children. Store at room temperature below 30 degrees C (86 degrees F). Protect from moisture. Keep container tightly closed. Throw away any unused medicine after the expiration date. NOTE: This sheet is a summary. It may not cover all possible information. If you have questions about this medicine, talk to your doctor, pharmacist, or health care provider.  2019 [employer] Standard (2009-04-25 14:42:31)

## 2019-02-28 LAB — CBC WITH DIFFERENTIAL/PLATELET
Basophils Absolute: 0 10*3/uL (ref 0.0–0.2)
Basos: 0 %
EOS (ABSOLUTE): 0 10*3/uL (ref 0.0–0.4)
Eos: 0 %
Hematocrit: 32.6 % — ABNORMAL LOW (ref 37.5–51.0)
Hemoglobin: 11.6 g/dL — ABNORMAL LOW (ref 13.0–17.7)
Immature Grans (Abs): 0 10*3/uL (ref 0.0–0.1)
Immature Granulocytes: 0 %
Lymphocytes Absolute: 1 10*3/uL (ref 0.7–3.1)
Lymphs: 8 %
MCH: 30.4 pg (ref 26.6–33.0)
MCHC: 35.6 g/dL (ref 31.5–35.7)
MCV: 85 fL (ref 79–97)
Monocytes Absolute: 1.1 10*3/uL — ABNORMAL HIGH (ref 0.1–0.9)
Monocytes: 8 %
Neutrophils Absolute: 10.9 10*3/uL — ABNORMAL HIGH (ref 1.4–7.0)
Neutrophils: 84 %
Platelets: 442 10*3/uL (ref 150–450)
RBC: 3.82 x10E6/uL — ABNORMAL LOW (ref 4.14–5.80)
RDW: 11.2 % — ABNORMAL LOW (ref 11.6–15.4)
WBC: 13.1 10*3/uL — ABNORMAL HIGH (ref 3.4–10.8)

## 2019-02-28 LAB — COMPREHENSIVE METABOLIC PANEL
ALT: 85 IU/L — ABNORMAL HIGH (ref 0–44)
AST: 24 IU/L (ref 0–40)
Albumin/Globulin Ratio: 1.2 (ref 1.2–2.2)
Albumin: 4.2 g/dL (ref 3.7–4.7)
Alkaline Phosphatase: 277 IU/L — ABNORMAL HIGH (ref 39–117)
BUN/Creatinine Ratio: 10 (ref 10–24)
BUN: 7 mg/dL — ABNORMAL LOW (ref 8–27)
Bilirubin Total: 0.9 mg/dL (ref 0.0–1.2)
CO2: 18 mmol/L — ABNORMAL LOW (ref 20–29)
Calcium: 9 mg/dL (ref 8.6–10.2)
Chloride: 104 mmol/L (ref 96–106)
Creatinine, Ser: 0.69 mg/dL — ABNORMAL LOW (ref 0.76–1.27)
GFR calc Af Amer: 110 mL/min/{1.73_m2} (ref >59)
GFR calc non Af Amer: 96 mL/min/{1.73_m2} (ref >59)
Globulin, Total: 3.6 g/dL (ref 1.5–4.5)
Glucose: 100 mg/dL — ABNORMAL HIGH (ref 65–99)
Potassium: 4.2 mmol/L (ref 3.5–5.2)
Sodium: 138 mmol/L (ref 134–144)
Total Protein: 7.8 g/dL (ref 6.0–8.5)

## 2019-02-28 LAB — RHEUMATOID ARTHRITIS PROFILE
Cyclic Citrullin Peptide Ab: 9 units (ref 0–19)
Rheumatoid fact SerPl-aCnc: 15.4 IU/mL — ABNORMAL HIGH (ref 0.0–13.9)

## 2019-02-28 LAB — LIPID PANEL
Chol/HDL Ratio: 4.6 ratio (ref 0.0–5.0)
Cholesterol, Total: 190 mg/dL (ref 100–199)
HDL: 41 mg/dL (ref >39)
LDL Calculated: 128 mg/dL — ABNORMAL HIGH (ref 0–99)
Triglycerides: 105 mg/dL (ref 0–149)
VLDL Cholesterol Cal: 21 mg/dL (ref 5–40)

## 2019-02-28 LAB — VITAMIN D 25 HYDROXY (VIT D DEFICIENCY, FRACTURES): Vit D, 25-Hydroxy: 8.4 ng/mL — ABNORMAL LOW (ref 30.0–100.0)

## 2019-02-28 LAB — VITAMIN B12: Vitamin B-12: 586 pg/mL (ref 232–1245)

## 2019-02-28 LAB — URIC ACID: Uric Acid: 6.5 mg/dL (ref 3.7–8.6)

## 2019-02-28 LAB — TSH: TSH: 2.72 u[IU]/mL (ref 0.450–4.500)

## 2019-03-07 ENCOUNTER — Other Ambulatory Visit: Payer: Self-pay | Admitting: Family Medicine

## 2019-03-07 ENCOUNTER — Encounter: Payer: Self-pay | Admitting: Family Medicine

## 2019-03-07 DIAGNOSIS — R768 Other specified abnormal immunological findings in serum: Secondary | ICD-10-CM

## 2019-03-07 DIAGNOSIS — R7689 Other specified abnormal immunological findings in serum: Secondary | ICD-10-CM

## 2019-03-07 DIAGNOSIS — E559 Vitamin D deficiency, unspecified: Secondary | ICD-10-CM

## 2019-03-07 MED ORDER — VITAMIN D (ERGOCALCIFEROL) 1.25 MG (50000 UNIT) PO CAPS: 50000.0000 [IU] | ORAL_CAPSULE | ORAL | 3 refills | Status: AC

## 2019-03-07 MED ORDER — VITAMIN D (ERGOCALCIFEROL) 1.25 MG (50000 UNIT) PO CAPS: 50000.0000 [IU] | ORAL_CAPSULE | ORAL | 3 refills | Status: DC

## 2019-03-12 ENCOUNTER — Ambulatory Visit (INDEPENDENT_AMBULATORY_CARE_PROVIDER_SITE_OTHER): Payer: Self-pay | Admitting: Family Medicine

## 2019-03-12 ENCOUNTER — Encounter: Payer: Self-pay | Admitting: Family Medicine

## 2019-03-12 ENCOUNTER — Other Ambulatory Visit: Payer: Self-pay

## 2019-03-12 VITALS — BP 156/70 | HR 84 | Temp 98.0°F | Ht 60.0 in | Wt 124.0 lb

## 2019-03-12 DIAGNOSIS — Z09 Encounter for follow-up examination after completed treatment for conditions other than malignant neoplasm: Secondary | ICD-10-CM

## 2019-03-12 DIAGNOSIS — M7042 Prepatellar bursitis, left knee: Secondary | ICD-10-CM

## 2019-03-12 DIAGNOSIS — R0602 Shortness of breath: Secondary | ICD-10-CM | POA: Insufficient documentation

## 2019-03-12 DIAGNOSIS — M7041 Prepatellar bursitis, right knee: Secondary | ICD-10-CM

## 2019-03-12 DIAGNOSIS — R0989 Other specified symptoms and signs involving the circulatory and respiratory systems: Secondary | ICD-10-CM

## 2019-03-12 MED ORDER — PREDNISONE 10 MG PO TABS: ORAL_TABLET | ORAL | 0 refills | Status: AC

## 2019-03-12 MED ORDER — ALBUTEROL SULFATE HFA 108 (90 BASE) MCG/ACT IN AERS: 2.0000 | INHALATION_SPRAY | Freq: Four times a day (QID) | RESPIRATORY_TRACT | 3 refills | Status: AC | PRN

## 2019-03-12 NOTE — Progress Notes (Signed)
[facility]   Established Patient Office Visit  Subjective:  Patient ID: George Walker, male    DOB: [birth year]  Age: 73 y.o. MRN: [id number]  CC:  Chief Complaint  Patient presents with  . Follow-up    neck, shoulder, hand pain     HPI George Walker is a 73 year old male who presents for follow up today.   Past Medical History:  Diagnosis Date  . Arthritis   . Bursitis   . Elevated rheumatoid factor   . Hypertension   . Pre-diabetes   . Vitamin D deficiency    Current Status: Since his last office, he reports that his joint pain and discomfort has returned. 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 He states that the week while he was on the Prednisone taper, he felt so much better. We have referred him to Rheumatology, but he hasn't received on call from them as of yet.   He denies fevers, chills, fatigue, recent infections, weight loss, and night sweats. He has not had any headaches, visual changes, dizziness, and falls. No chest pain, heart palpitations, cough and shortness of breath reported. No reports of GI problems such as nausea, vomiting, diarrhea, and constipation. He has no reports of blood in stools, dysuria and hematuria. No depression or anxiety reported.  He denies pain today.   History reviewed. No pertinent surgical history.  Family History  Problem Relation Age of Onset  . Other Mother        healthy     Social History   Socioeconomic History  . Marital status: Widowed    Spouse name: Not on file  . Number of children: Not on file  . Years of education: Not on file  . Highest education level: Not on file  Occupational History  . Not on file  Social Needs  . Financial resource strain: Not on file  . Food insecurity:    Worry: Not on file    Inability: Not on file  . Transportation needs:    Medical: Not on file    Non-medical: Not on file  Tobacco Use  . Smoking  status: Never Smoker  . Smokeless tobacco: Current User  Substance and Sexual Activity  . Alcohol use: Yes  . Drug use: Never  . Sexual activity: Never  Lifestyle  . Physical activity:    Days per week: Not on file    Minutes per session: Not on file  . Stress: Not on file  Relationships  . Social connections:    Talks on phone: Not on file    Gets together: Not on file    Attends religious service: Not on file    Active member of club or organization: Not on file    Attends meetings of clubs or organizations: Not on file    Relationship status: Not on file  . Intimate partner violence:    Fear of current or ex partner: Not on file    Emotionally abused: Not on file    Physically abused: Not on file    Forced sexual activity: Not on file  Other Topics Concern  . Not on file  Social History Narrative  . Not on file    Outpatient Medications Prior to Visit  Medication Sig Dispense Refill  . diclofenac (VOLTAREN) 50 MG EC tablet Take 1 tablet (50 mg total) by mouth 2 (two) times daily. 60 tablet 1  . Vitamin D,  Ergocalciferol, (DRISDOL) 1.25 MG (50000 UT) CAPS capsule Take 1 capsule (50,000 Units total) by mouth every 7 (seven) days. 5 capsule 3  . predniSONE (DELTASONE) 10 MG tablet Day #1: Take 6 tablets  Day #2: Take 5 tablets Day #3: Take 4 tablets Day #4: Take 3 tablets Day #5: Take 2 tablets Day #6: Take 1 tablet, then complete. (Patient not taking: Reported on 03/12/2019) 21 tablet 0   No facility-administered medications prior to visit.     No Known Allergies  ROS Review of Systems  Constitutional: Negative.   HENT: Negative.   Eyes: Negative.   Respiratory: Negative.   Cardiovascular: Negative.   Gastrointestinal: Negative.   Endocrine: Negative.   Genitourinary: Negative.   Musculoskeletal: Positive for joint swelling (ankles, great toes, neck area, and elbows).  Skin: Negative.   Allergic/Immunologic: Negative.   Neurological: Negative.    Hematological: Negative.   Psychiatric/Behavioral: Negative.    Objective:    Physical Exam  Constitutional: He is oriented to person, place, and time. He appears well-developed and well-nourished.  HENT:  Head: Normocephalic and atraumatic.  Eyes: Conjunctivae are normal.  Neck: Normal range of motion. Neck supple.  Cardiovascular: Normal rate, regular rhythm, normal heart sounds and intact distal pulses.  Pulmonary/Chest: Effort normal and breath sounds normal.  Crackles in left lungs.  Abdominal: Soft. Bowel sounds are normal.  Musculoskeletal:        General: Tenderness (joints, elbows) present.  Neurological: He is oriented to person, place, and time.  Skin: Skin is warm and dry.  Psychiatric: He has a normal mood and affect. His behavior is normal. Judgment and thought content normal.  Nursing note and vitals reviewed.   BP (!) 156/70 (BP Location: Left Arm, Patient Position: Sitting, Cuff Size: Small)   Pulse 84   Temp 98 F (36.7 C) (Oral)   Ht 5' (1.524 m)   Wt 124 lb (56.2 kg)   SpO2 96%   BMI 24.22 kg/m  Wt Readings from Last 3 Encounters:  03/12/19 124 lb (56.2 kg)  02/26/19 122 lb (55.3 kg)     Health Maintenance Due  Topic Date Due  . Hepatitis C Screening  [birth year]  . COLONOSCOPY  10/03/1997  . PNA vac Low Risk Adult (1 of 2 - PCV13) 10/03/2012    There are no preventive care reminders to display for this patient.  Lab Results  Component Value Date   TSH 2.720 02/26/2019   Lab Results  Component Value Date   WBC 13.1 (H) 02/26/2019   HGB 11.6 (L) 02/26/2019   HCT 32.6 (L) 02/26/2019   MCV 85 02/26/2019   PLT 442 02/26/2019   Lab Results  Component Value Date   NA 138 02/26/2019   K 4.2 02/26/2019   CO2 18 (L) 02/26/2019   GLUCOSE 100 (H) 02/26/2019   BUN 7 (L) 02/26/2019   CREATININE 0.69 (L) 02/26/2019   BILITOT 0.9 02/26/2019   ALKPHOS 277 (H) 02/26/2019   AST 24 02/26/2019   ALT 85 (H) 02/26/2019   PROT 7.8 02/26/2019    ALBUMIN 4.2 02/26/2019   CALCIUM 9.0 02/26/2019   ANIONGAP 6 02/13/2019   Lab Results  Component Value Date   CHOL 190 02/26/2019   Lab Results  Component Value Date   HDL 41 02/26/2019   Lab Results  Component Value Date   LDLCALC 128 (H) 02/26/2019   Lab Results  Component Value Date   TRIG 105 02/26/2019   Lab Results  Component Value  Date   CHOLHDL 4.6 02/26/2019   Lab Results  Component Value Date   HGBA1C 5.8 (A) 02/26/2019      Assessment & Plan:   1. Prepatellar bursitis of both knees We will refill Prednisone today to aide in relieving symptoms.  - predniSONE (DELTASONE) 10 MG tablet; Day #1: Take 6 tablets  Day #2: Take 5 tablets Day #3: Take 4 tablets Day #4: Take 3 tablets Day #5: Take 2 tablets Day #6: Take 1 tablet, then complete.  Dispense: 21 tablet; Refill: 0  2. Chest crackles Crackles noted in left lobes. We will continue to monitor.  - albuterol (PROVENTIL HFA;VENTOLIN HFA) 108 (90 Base) MCG/ACT inhaler; Inhale 2 puffs into the lungs every 6 (six) hours as needed for wheezing or shortness of breath.  Dispense: 1 Inhaler; Refill: 3  3. Shortness of breath Stable today. No signs or symptoms of respiratory distress noted or reported today.  - albuterol (PROVENTIL HFA;VENTOLIN HFA) 108 (90 Base) MCG/ACT inhaler; Inhale 2 puffs into the lungs every 6 (six) hours as needed for wheezing or shortness of breath.  Dispense: 1 Inhaler; Refill: 3  4. Follow up He will follow up in 1 month.   Meds ordered this encounter  Medications  . albuterol (PROVENTIL HFA;VENTOLIN HFA) 108 (90 Base) MCG/ACT inhaler    Sig: Inhale 2 puffs into the lungs every 6 (six) hours as needed for wheezing or shortness of breath.    Dispense:  1 Inhaler    Refill:  3  . predniSONE (DELTASONE) 10 MG tablet    Sig: Day #1: Take 6 tablets  Day #2: Take 5 tablets Day #3: Take 4 tablets Day #4: Take 3 tablets Day #5: Take 2 tablets Day #6: Take 1 tablet, then complete.     Dispense:  21 tablet    Refill:  0    No orders of the defined types were placed in this encounter.   Referral Orders  No referral(s) requested today    Raliegh Ip,  MSN, FNP-C [facility] [address] [place], [place] [address] [phone]   Problem List Items Addressed This Visit      Musculoskeletal and Integument   Prepatellar bursitis of both knees   Relevant Medications   predniSONE (DELTASONE) 10 MG tablet    Other Visit Diagnoses    Chest crackles    -  Primary   Relevant Medications   albuterol (PROVENTIL HFA;VENTOLIN HFA) 108 (90 Base) MCG/ACT inhaler   Shortness of breath       Relevant Medications   albuterol (PROVENTIL HFA;VENTOLIN HFA) 108 (90 Base) MCG/ACT inhaler   Follow up          Meds ordered this encounter  Medications  . albuterol (PROVENTIL HFA;VENTOLIN HFA) 108 (90 Base) MCG/ACT inhaler    Sig: Inhale 2 puffs into the lungs every 6 (six) hours as needed for wheezing or shortness of breath.    Dispense:  1 Inhaler    Refill:  3  . predniSONE (DELTASONE) 10 MG tablet    Sig: Day #1: Take 6 tablets  Day #2: Take 5 tablets Day #3: Take 4 tablets Day #4: Take 3 tablets Day #5: Take 2 tablets Day #6: Take 1 tablet, then complete.    Dispense:  21 tablet    Refill:  0    Follow-up: Return in about 1 month (around 04/12/2019).    Kallie Locks, FNP

## 2019-03-12 NOTE — Patient Instructions (Signed)
Shortness of Breath, Adult Shortness of breath means you have trouble breathing. Shortness of breath could be a sign of a medical problem. Follow these instructions at home:   Watch for any changes in your symptoms.  Do not use any products that contain nicotine or tobacco, such as cigarettes, e-cigarettes, and chewing tobacco.  Do not smoke. Smoking can cause shortness of breath. If you need help to quit smoking, ask your doctor.  Avoid things that can make it harder to breathe, such as: ? Mold. ? Dust. ? Air pollution. ? Chemical smells. ? Things that can cause allergy symptoms (allergens), if you have allergies.  Keep your living space clean. Use products that help remove mold and dust.  Rest as needed. Slowly return to your normal activities.  Take over-the-counter and prescription medicines only as told by your doctor. This includes oxygen therapy and inhaled medicines.  Keep all follow-up visits as told by your doctor. This is important. Contact a doctor if:  Your condition does not get better as soon as expected.  You have a hard time doing your normal activities, even after you rest.  You have new symptoms. Get help right away if:  Your shortness of breath gets worse.  You have trouble breathing when you are resting.  You feel light-headed or you pass out (faint).  You have a cough that is not helped by medicines.  You cough up blood.  You have pain with breathing.  You have pain in your chest, arms, shoulders, or belly (abdomen).  You have a fever.  You cannot walk up stairs.  You cannot exercise the way you normally do. These symptoms may represent a serious problem that is an emergency. Do not wait to see if the symptoms will go away. Get medical help right away. Call your local emergency services (911 in the [place] Do not drive yourself to the hospital. Summary  Shortness of breath is when you have trouble breathing enough air. It can be a sign of a  medical problem.  Avoid things that make it hard for you to breathe, such as smoking, pollution, mold, and dust.  Watch for any changes in your symptoms. Contact your doctor if you do not get better or you get worse. This information is not intended to replace advice given to you by your health care provider. Make sure you discuss any questions you have with your health care provider. Document Released: 05/24/2008 Document Revised: 05/08/2018 Document Reviewed: 05/08/2018 Elsevier Interactive Patient Education  2019 Elsevier Inc. Albuterol inhalation aerosol What is this medicine? ALBUTEROL (al Norm[place]) is a bronchodilator. It helps open up the airways in your lungs to make it easier to breathe. This medicine is used to treat and to prevent bronchospasm. This medicine may be used for other purposes; ask your health care provider or pharmacist if you have questions. COMMON BRAND NAME(S): Proair HFA, Proventil, Proventil HFA, Respirol, Ventolin, Ventolin HFA What should I tell my health care provider before I take this medicine? They need to know if you have any of the following conditions: -diabetes -heart disease or irregular heartbeat -high blood pressure -pheochromocytoma -seizures -thyroid disease -an unusual or allergic reaction to albuterol, levalbuterol, sulfites, other medicines, foods, dyes, or preservatives -pregnant or trying to get pregnant -breast-feeding How should I use this medicine? This medicine is for inhalation through the mouth. Follow the directions on your prescription label. Take your medicine at regular intervals. Do not use more often than directed. Make  sure that you are using your inhaler correctly. Ask you doctor or health care provider if you have any questions. Talk to your pediatrician regarding the use of this medicine in children. Special care may be needed. Overdosage: If you think you have taken too much of this medicine contact a poison control  center or emergency room at once. NOTE: This medicine is only for you. Do not share this medicine with others. What if I miss a dose? If you miss a dose, use it as soon as you can. If it is almost time for your next dose, use only that dose. Do not use double or extra doses. What may interact with this medicine? -anti-infectives like chloroquine and pentamidine -caffeine -cisapride -diuretics -medicines for colds -medicines for depression or for emotional or psychotic conditions -medicines for weight loss including some herbal products -methadone -some antibiotics like clarithromycin, erythromycin, levofloxacin, and linezolid -some heart medicines -steroid hormones like dexamethasone, cortisone, hydrocortisone -theophylline -thyroid hormones This list may not describe all possible interactions. Give your health care provider a list of all the medicines, herbs, non-prescription drugs, or dietary supplements you use. Also tell them if you smoke, drink alcohol, or use illegal drugs. Some items may interact with your medicine. What should I watch for while using this medicine? Tell your doctor or health care professional if your symptoms do not improve. Do not use extra albuterol. If your asthma or bronchitis gets worse while you are using this medicine, call your doctor right away. If your mouth gets dry try chewing sugarless gum or sucking hard candy. Drink water as directed. What side effects may I notice from receiving this medicine? Side effects that you should report to your doctor or health care professional as soon as possible: -allergic reactions like skin rash, itching or hives, swelling of the face, lips, or tongue -breathing problems -chest pain -feeling faint or lightheaded, falls -high blood pressure -irregular heartbeat -fever -muscle cramps or weakness -pain, tingling, numbness in the hands or feet -vomiting Side effects that usually do not require medical attention  (report to your doctor or health care professional if they continue or are bothersome): -cough -difficulty sleeping -headache -nervousness or trembling -stomach upset -stuffy or runny nose -throat irritation -unusual taste This list may not describe all possible side effects. Call your doctor for medical advice about side effects. You may report side effects to FDA at 1-800-FDA-1088. Where should I keep my medicine? Keep out of the reach of children. Store at room temperature between 15 and 30 degrees C (59 and 86 degrees F). The contents are under pressure and may burst when exposed to heat or flame. Do not freeze. This medicine does not work as well if it is too cold. Throw away any unused medicine after the expiration date. Inhalers need to be thrown away after the labeled number of puffs have been used or by the expiration date; whichever comes first. Ventolin HFA should be thrown away 12 months after removing from foil pouch. Check the instructions that come with your medicine. NOTE: This sheet is a summary. It may not cover all possible information. If you have questions about this medicine, talk to your doctor, pharmacist, or health care provider.  2019 Elsevier/Gold Standard (2013-05-24 10:57:17)

## 2019-03-15 ENCOUNTER — Ambulatory Visit: Payer: Self-pay | Admitting: Internal Medicine

## 2019-04-13 ENCOUNTER — Ambulatory Visit: Payer: Self-pay | Admitting: Family Medicine

## 2019-05-01 ENCOUNTER — Ambulatory Visit: Payer: Self-pay | Admitting: Family Medicine

## 2019-05-31 ENCOUNTER — Telehealth: Payer: Self-pay

## 2019-06-01 ENCOUNTER — Ambulatory Visit: Payer: Self-pay | Admitting: Family Medicine

## 2019-07-12 NOTE — Telephone Encounter (Signed)
Note not needed 

## 2019-10-09 IMAGING — CR DG HAND COMPLETE 3+V*L*
3 series · 3 of 3 positions shown · non-contrast
Comparison: None.

CLINICAL DATA: 72-year-old male with a history of left hand pain

EXAM:
LEFT HAND - COMPLETE 3+ VIEW

[hand pa]
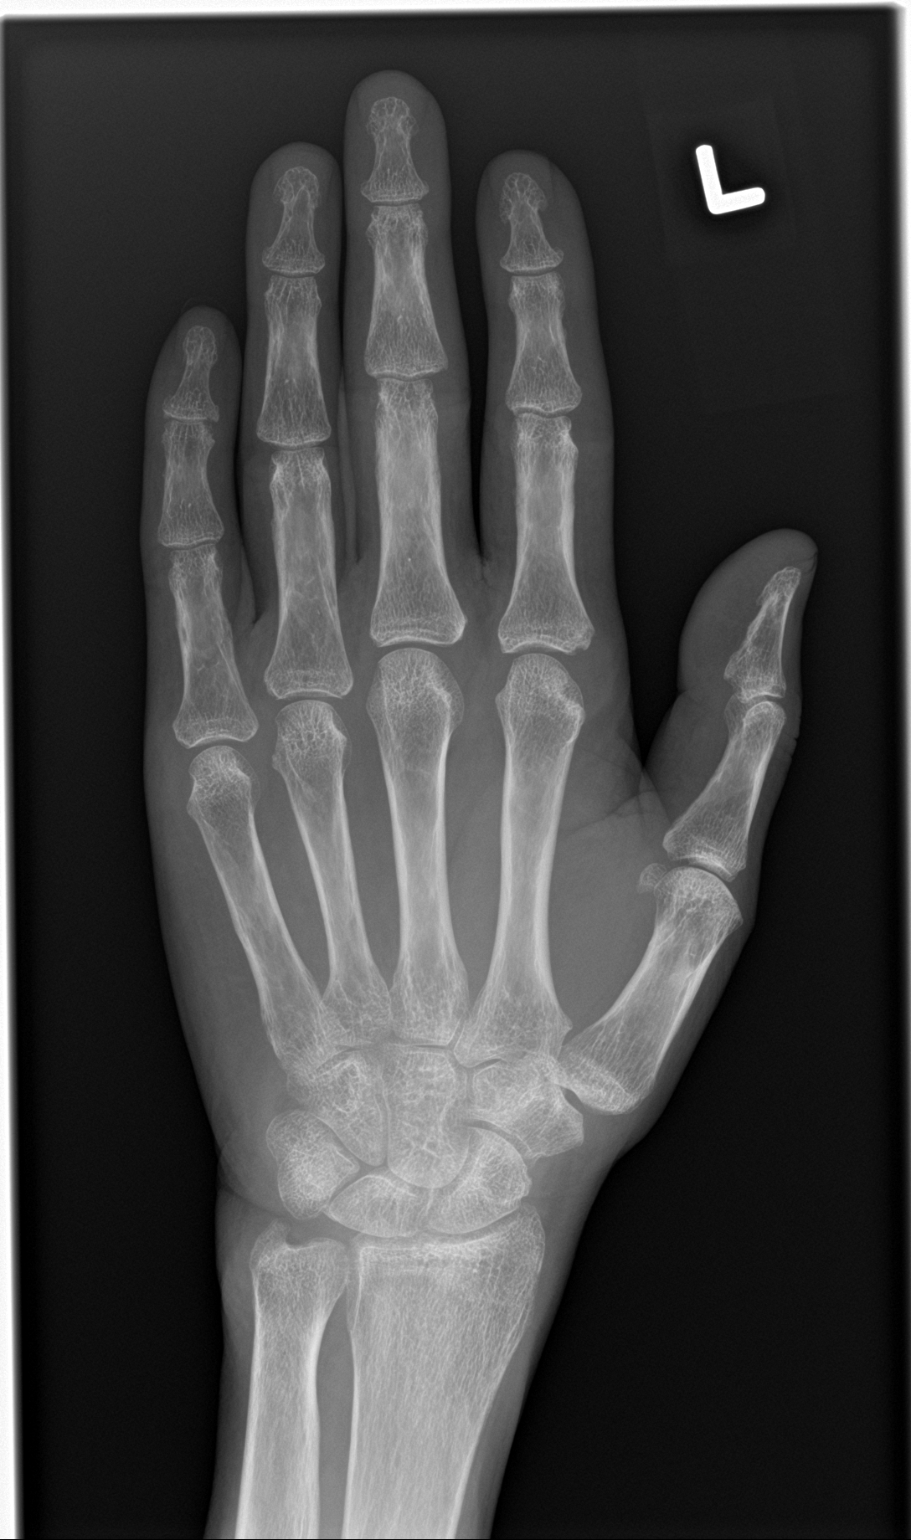

[hand obl]
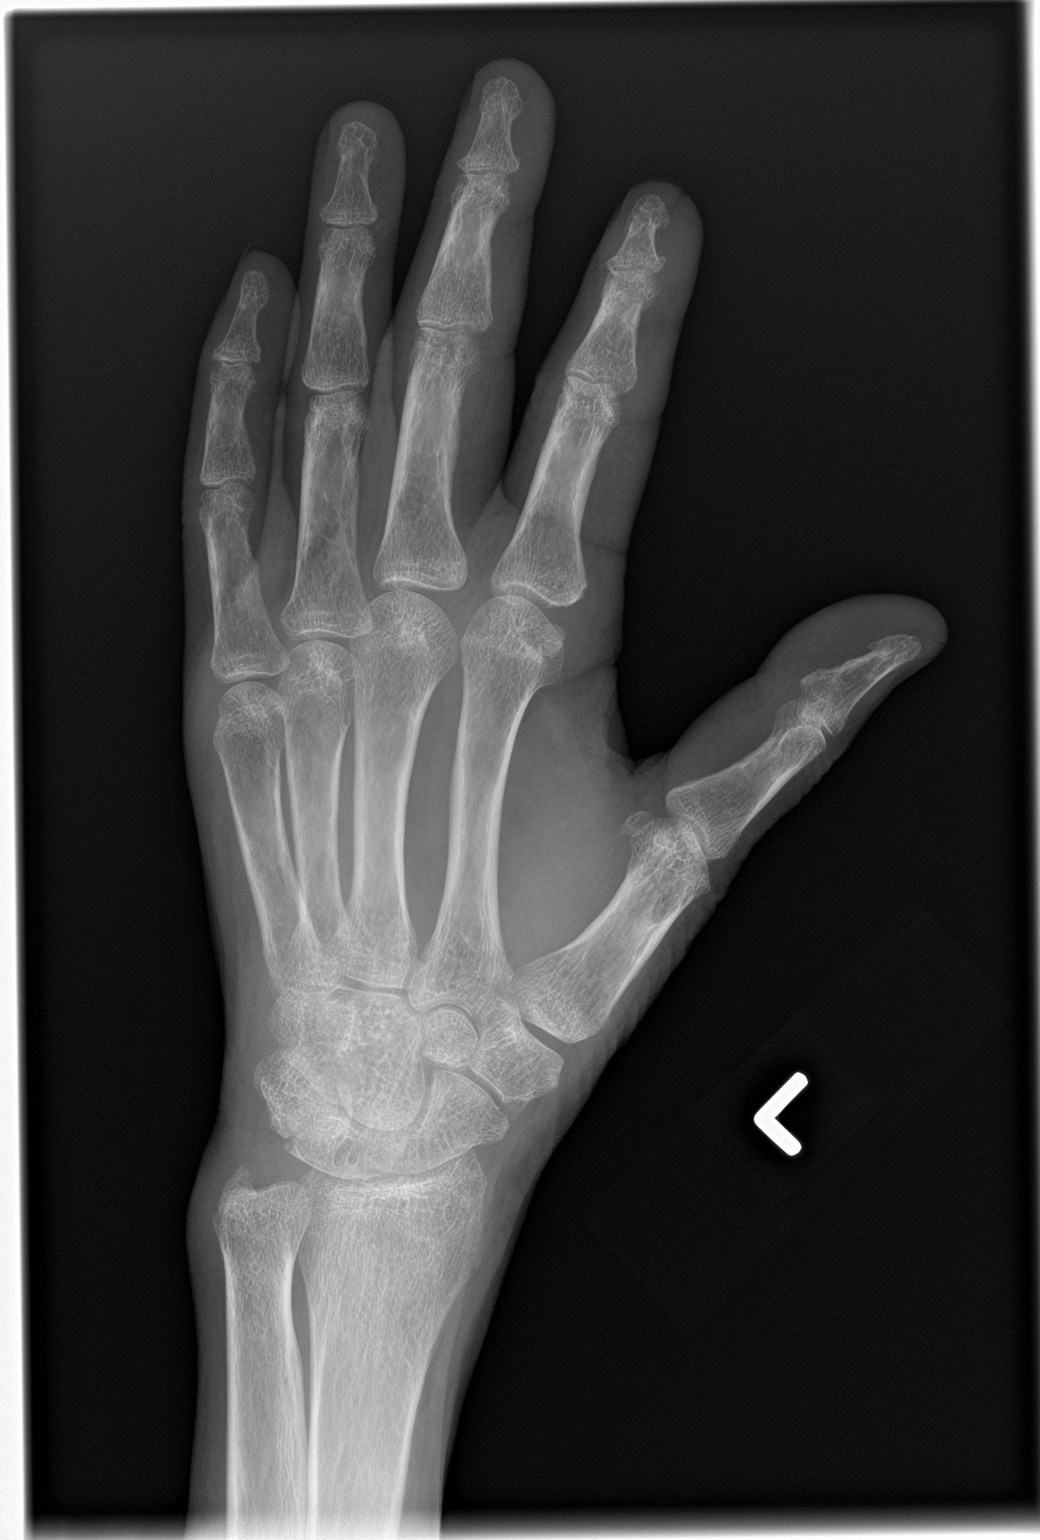

[hand lat]
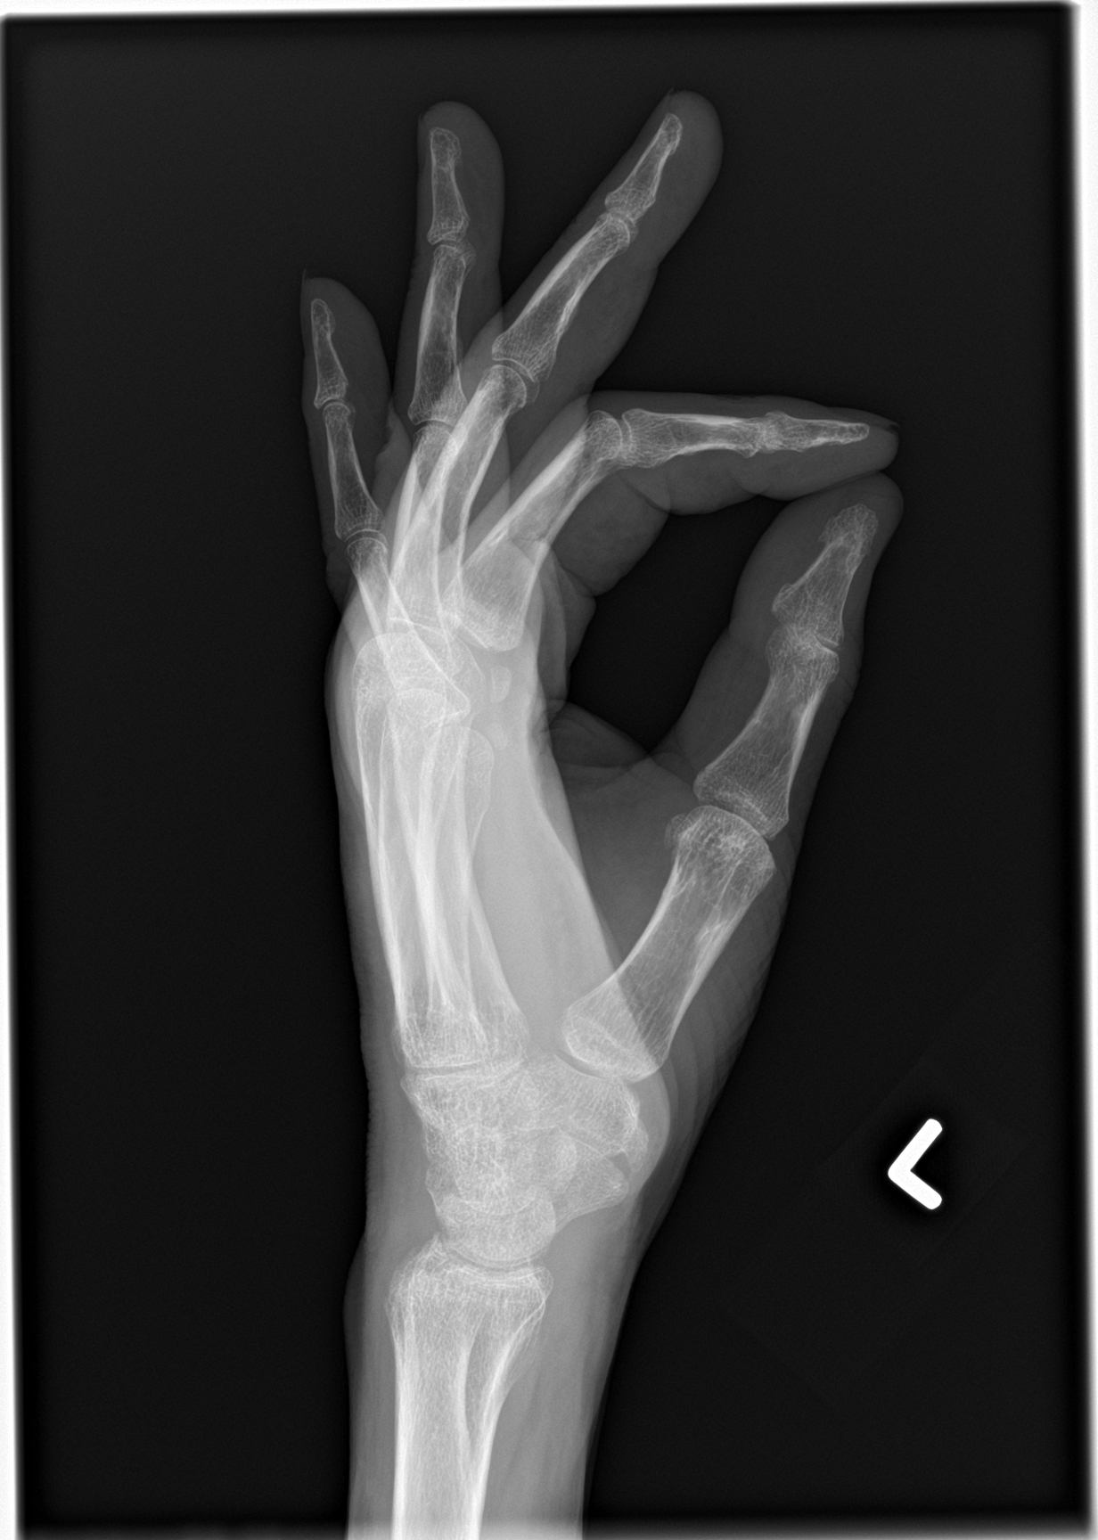

[3 of 3 positions shown; findings below may reference images not displayed]

FINDINGS: Osteopenia. No acute displaced fracture. Mild degenerative changes
of the interphalangeal joints. Erosion at the base of the proximal
phalanx, radial aspect, second digit of the left hand.

Mild degenerative changes at the wrist.
IMPRESSION: Negative for acute fracture.

Early erosion at the base of the proximal phalanx of the second
digit of the left hand.
